# Patient Record
Sex: Male | Born: 2000 | Race: White | Hispanic: No | Marital: Single | Smoking: Never smoker
Health system: Southern US, Community
[De-identification: ages and names within clinical notes are randomized; demographics above are authoritative.]

## PROBLEM LIST (undated history)

## (undated) DIAGNOSIS — K219 Gastro-esophageal reflux disease without esophagitis: Secondary | ICD-10-CM

## (undated) DIAGNOSIS — I498 Other specified cardiac arrhythmias: Secondary | ICD-10-CM

## (undated) DIAGNOSIS — R011 Cardiac murmur, unspecified: Secondary | ICD-10-CM

## (undated) DIAGNOSIS — R55 Syncope and collapse: Secondary | ICD-10-CM

## (undated) DIAGNOSIS — M249 Joint derangement, unspecified: Secondary | ICD-10-CM

## (undated) DIAGNOSIS — G90A Postural orthostatic tachycardia syndrome (POTS): Secondary | ICD-10-CM

## (undated) DIAGNOSIS — R000 Tachycardia, unspecified: Secondary | ICD-10-CM

## (undated) DIAGNOSIS — S022XXA Fracture of nasal bones, initial encounter for closed fracture: Secondary | ICD-10-CM

## (undated) DIAGNOSIS — J342 Deviated nasal septum: Secondary | ICD-10-CM

## (undated) DIAGNOSIS — I951 Orthostatic hypotension: Secondary | ICD-10-CM

## (undated) HISTORY — PX: CIRCUMCISION: SUR203

## (undated) HISTORY — DX: Other specified cardiac arrhythmias: I49.8

## (undated) HISTORY — DX: Tachycardia, unspecified: R00.0

## (undated) HISTORY — DX: Orthostatic hypotension: I95.1

## (undated) HISTORY — DX: Postural orthostatic tachycardia syndrome (POTS): G90.A

---
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
## 2019-02-15 DIAGNOSIS — R55 Syncope and collapse: Secondary | ICD-10-CM | POA: Diagnosis not present

## 2019-02-15 DIAGNOSIS — I498 Other specified cardiac arrhythmias: Secondary | ICD-10-CM | POA: Diagnosis not present

## 2019-02-15 DIAGNOSIS — R002 Palpitations: Secondary | ICD-10-CM | POA: Diagnosis not present

## 2019-02-15 MED FILL — PROPRANOLOL HCL 10 MG TAB: 10 | 90 days supply | Qty: 180 | Fill #0

## 2019-02-18 ENCOUNTER — Other Ambulatory Visit: Payer: Self-pay

## 2019-02-18 DIAGNOSIS — Z20822 Contact with and (suspected) exposure to covid-19: Secondary | ICD-10-CM

## 2019-02-20 LAB — NOVEL CORONAVIRUS, NAA: SARS-CoV-2, NAA: NOT DETECTED

## 2019-03-11 DIAGNOSIS — R55 Syncope and collapse: Secondary | ICD-10-CM | POA: Diagnosis not present

## 2019-04-11 ENCOUNTER — Ambulatory Visit: Payer: 59 | Attending: Internal Medicine

## 2019-04-11 DIAGNOSIS — Z20822 Contact with and (suspected) exposure to covid-19: Secondary | ICD-10-CM | POA: Diagnosis not present

## 2019-04-12 LAB — NOVEL CORONAVIRUS, NAA: SARS-CoV-2, NAA: NOT DETECTED

## 2019-05-20 DIAGNOSIS — Z1152 Encounter for screening for COVID-19: Secondary | ICD-10-CM | POA: Diagnosis not present

## 2019-05-31 DIAGNOSIS — Z1152 Encounter for screening for COVID-19: Secondary | ICD-10-CM | POA: Diagnosis not present

## 2019-06-02 DIAGNOSIS — Z1152 Encounter for screening for COVID-19: Secondary | ICD-10-CM | POA: Diagnosis not present

## 2019-06-06 DIAGNOSIS — Z20822 Contact with and (suspected) exposure to covid-19: Secondary | ICD-10-CM | POA: Diagnosis not present

## 2019-06-06 DIAGNOSIS — Z1152 Encounter for screening for COVID-19: Secondary | ICD-10-CM | POA: Diagnosis not present

## 2019-06-17 DIAGNOSIS — Z1152 Encounter for screening for COVID-19: Secondary | ICD-10-CM | POA: Diagnosis not present

## 2019-06-21 DIAGNOSIS — Z1152 Encounter for screening for COVID-19: Secondary | ICD-10-CM | POA: Diagnosis not present

## 2019-06-28 DIAGNOSIS — Z1152 Encounter for screening for COVID-19: Secondary | ICD-10-CM | POA: Diagnosis not present

## 2019-07-01 DIAGNOSIS — Z1152 Encounter for screening for COVID-19: Secondary | ICD-10-CM | POA: Diagnosis not present

## 2019-07-06 DIAGNOSIS — Z1152 Encounter for screening for COVID-19: Secondary | ICD-10-CM | POA: Diagnosis not present

## 2019-07-13 DIAGNOSIS — Z1152 Encounter for screening for COVID-19: Secondary | ICD-10-CM | POA: Diagnosis not present

## 2019-07-26 DIAGNOSIS — Z1152 Encounter for screening for COVID-19: Secondary | ICD-10-CM | POA: Diagnosis not present

## 2019-07-29 DIAGNOSIS — Z1152 Encounter for screening for COVID-19: Secondary | ICD-10-CM | POA: Diagnosis not present

## 2019-08-09 ENCOUNTER — Ambulatory Visit: Payer: 59 | Admitting: Sports Medicine

## 2019-08-09 ENCOUNTER — Other Ambulatory Visit: Payer: Self-pay

## 2019-08-09 DIAGNOSIS — S60222A Contusion of left hand, initial encounter: Secondary | ICD-10-CM | POA: Diagnosis not present

## 2019-08-10 DIAGNOSIS — S60222D Contusion of left hand, subsequent encounter: Secondary | ICD-10-CM | POA: Diagnosis not present

## 2020-01-26 ENCOUNTER — Other Ambulatory Visit (HOSPITAL_COMMUNITY): Payer: Self-pay | Admitting: Family Medicine

## 2020-01-26 MED FILL — NAPROXEN 500 MG TABLET: 500 | 20 days supply | Qty: 40 | Fill #0

## 2020-01-26 MED FILL — tiZANidine HCL 4 MG TABS: 4 | 10 days supply | Qty: 20 | Fill #0

## 2020-06-18 ENCOUNTER — Other Ambulatory Visit (HOSPITAL_COMMUNITY): Payer: Self-pay | Admitting: Family Medicine

## 2020-06-18 MED FILL — MELOXICAM 15 MG TABLET: 15 | 30 days supply | Qty: 30 | Fill #0

## 2020-08-01 ENCOUNTER — Other Ambulatory Visit (HOSPITAL_COMMUNITY): Payer: Self-pay

## 2020-08-01 MED ORDER — NAPROXEN 500 MG PO TABS
500.0000 mg | ORAL_TABLET | Freq: Two times a day (BID) | ORAL | 0 refills | Status: AC
  Filled 2020-08-01: qty 30, 15d supply, fill #0

## 2020-08-09 ENCOUNTER — Other Ambulatory Visit (HOSPITAL_COMMUNITY): Payer: Self-pay

## 2023-04-13 ENCOUNTER — Other Ambulatory Visit (HOSPITAL_COMMUNITY): Payer: Self-pay

## 2023-04-22 ENCOUNTER — Other Ambulatory Visit (HOSPITAL_COMMUNITY): Payer: Self-pay
